# Patient Record
Sex: Female | Born: 2000 | Hispanic: Yes | Marital: Single | Smoking: Never smoker
Health system: Southern US, Community
[De-identification: ages and names within clinical notes are randomized; demographics above are authoritative.]

## PROBLEM LIST (undated history)

## (undated) DIAGNOSIS — E109 Type 1 diabetes mellitus without complications: Secondary | ICD-10-CM

## (undated) DIAGNOSIS — F419 Anxiety disorder, unspecified: Secondary | ICD-10-CM

## (undated) DIAGNOSIS — S82892A Other fracture of left lower leg, initial encounter for closed fracture: Secondary | ICD-10-CM

## (undated) HISTORY — PX: TYMPANOSTOMY TUBE PLACEMENT: SHX32

## (undated) HISTORY — DX: Anxiety disorder, unspecified: F41.9

## (undated) HISTORY — DX: Type 1 diabetes mellitus without complications: E10.9

---
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
## 2021-10-18 IMAGING — DX DG HAND COMPLETE 3+V*R*
3 series · 3 of 3 positions shown · non-contrast
Comparison: None.

CLINICAL DATA: Right hand and wrist pain after injury. Hit with
ball in right wrist.

EXAM:
RIGHT HAND - COMPLETE 3+ VIEW

[hand pa]
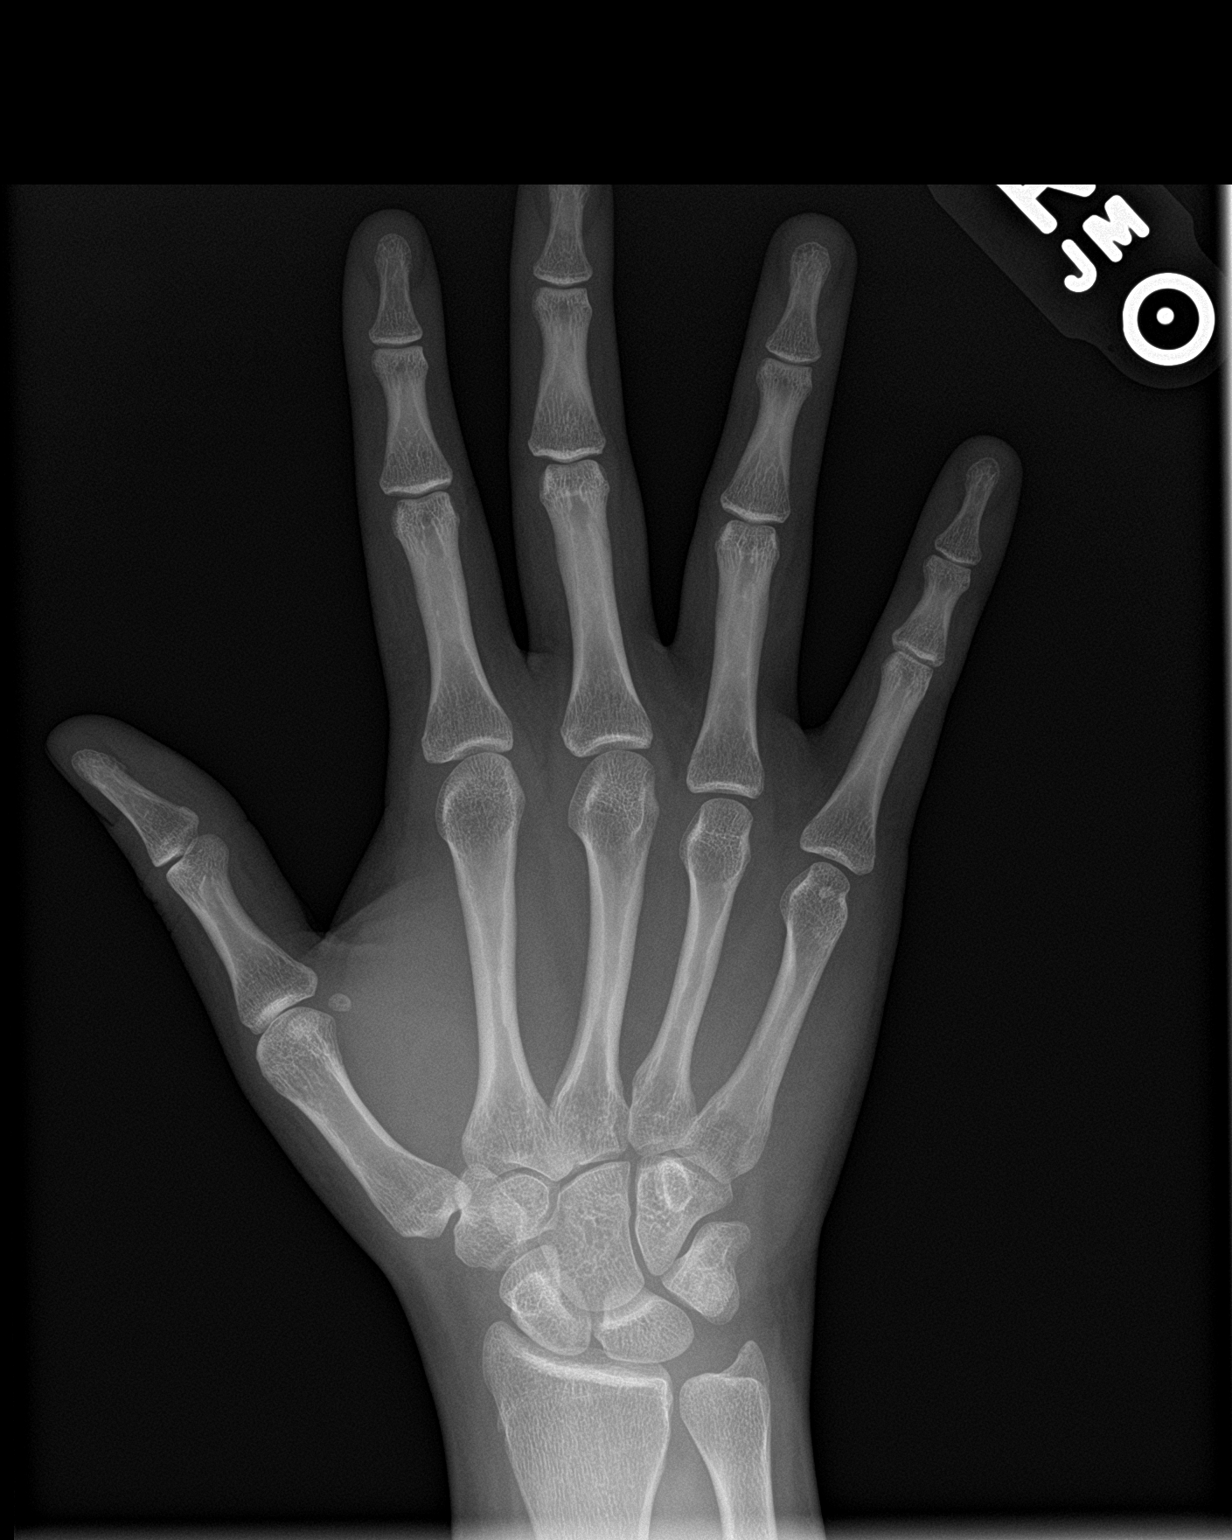

[hand obl]
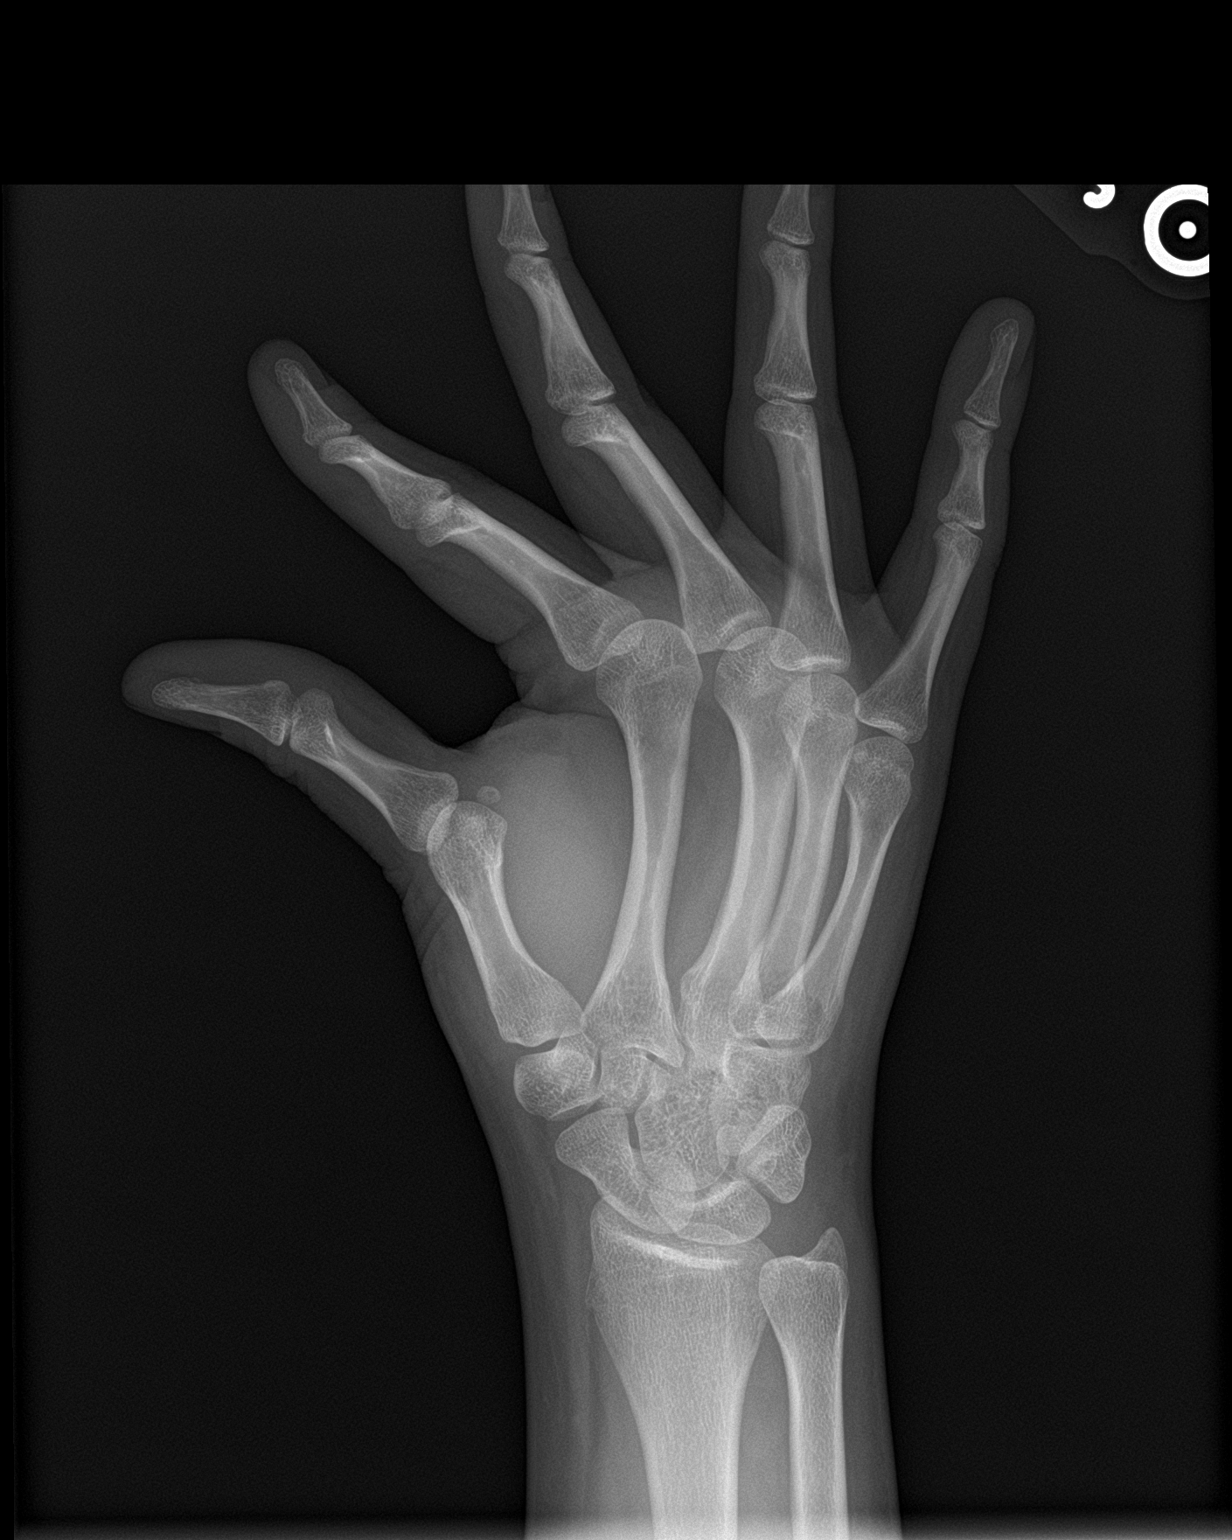

[hand lat]
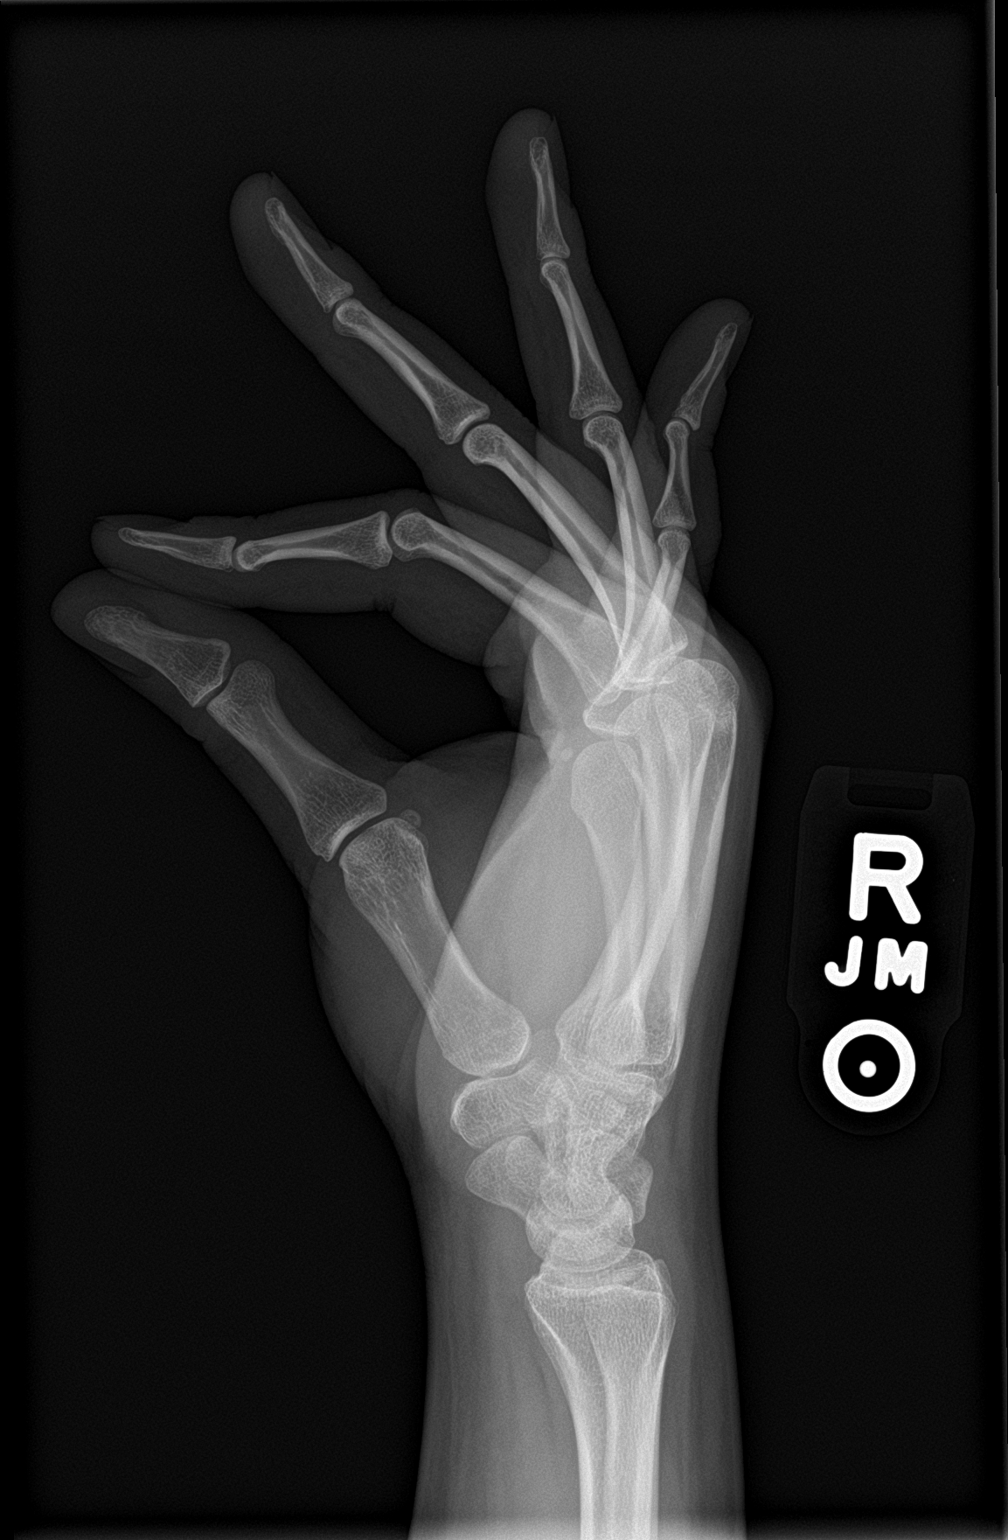

[3 of 3 positions shown; findings below may reference images not displayed]

FINDINGS: There is no evidence of fracture or dislocation. There is no
evidence of arthropathy or other focal bone abnormality. Soft
tissues are unremarkable.
IMPRESSION: Negative radiographs of the right hand.

## 2022-09-13 ENCOUNTER — Ambulatory Visit: Payer: No Typology Code available for payment source | Admitting: Family Medicine

## 2022-09-20 ENCOUNTER — Ambulatory Visit (INDEPENDENT_AMBULATORY_CARE_PROVIDER_SITE_OTHER): Payer: No Typology Code available for payment source | Admitting: Family Medicine

## 2022-09-20 ENCOUNTER — Encounter: Payer: Self-pay | Admitting: Family Medicine

## 2022-09-20 VITALS — BP 118/70 | HR 89 | Temp 97.3°F | Ht 66.5 in | Wt 178.4 lb

## 2022-09-20 DIAGNOSIS — G8929 Other chronic pain: Secondary | ICD-10-CM | POA: Diagnosis not present

## 2022-09-20 DIAGNOSIS — M545 Low back pain, unspecified: Secondary | ICD-10-CM | POA: Diagnosis not present

## 2022-09-20 DIAGNOSIS — E1069 Type 1 diabetes mellitus with other specified complication: Secondary | ICD-10-CM | POA: Diagnosis not present

## 2022-09-20 DIAGNOSIS — Z23 Encounter for immunization: Secondary | ICD-10-CM | POA: Diagnosis not present

## 2022-09-20 DIAGNOSIS — F419 Anxiety disorder, unspecified: Secondary | ICD-10-CM | POA: Diagnosis not present

## 2022-09-20 DIAGNOSIS — Z7184 Encounter for health counseling related to travel: Secondary | ICD-10-CM | POA: Diagnosis not present

## 2022-09-20 DIAGNOSIS — E109 Type 1 diabetes mellitus without complications: Secondary | ICD-10-CM | POA: Insufficient documentation

## 2022-09-20 DIAGNOSIS — F32A Depression, unspecified: Secondary | ICD-10-CM

## 2022-09-20 LAB — POC URINALSYSI DIPSTICK (AUTOMATED)
Bilirubin, UA: NEGATIVE
Blood, UA: NEGATIVE
Glucose, UA: POSITIVE — AB
Ketones, UA: NEGATIVE
Leukocytes, UA: NEGATIVE
Nitrite, UA: NEGATIVE
Protein, UA: NEGATIVE
Spec Grav, UA: >=1.03 — AB (ref 1.010–1.025)
Urobilinogen, UA: 0.2 E.U./dL
pH, UA: 5.5 (ref 5.0–8.0)

## 2022-09-20 NOTE — Progress Notes (Unsigned)
Ph: [phone] Fax: [phone]   Patient ID: Debbie Mckinney, female    DOB: 06-25-2000, 22 y.o.   MRN: [id number]  This visit was conducted in person.  BP 118/70   Pulse 89   Temp (!) 97.3 F (36.3 C) (Temporal)   Ht 5' 6.5" (1.689 m)   Wt 178 lb 6 oz (80.9 kg)   LMP 08/21/2022   SpO2 97%   BMI 28.36 kg/m    CC: new pt to establish  Subjective:   HPI: Debbie Mckinney is a 22 y.o. female presenting on 09/20/2022 for New Patient (Initial Visit)   Previously saw [facility],   Known Type 1 Diabetes diagnosed 01/2015 followed by Dr Leda Roys [facility] Endo managed on Humalog Omnipod insulin pump . Uses Dexcom continuous glucose monitor as well (54u daily, 35.4 basal, 23.2 bolus). Latest A1c 9.4%.   Bilateral low back pain without radiation present for the past 1 year. Denies inciting trauma/injury or falls. Can be intense pain for 30 min at a time. Can go weeks without pain. Can come on when supine.  No fever, no dysuria, urgency, frequency, lower abd pain, nausea/vomiting, flank pain. No blood in urine.   Denies h/o scoliosis.  H/o back pain in middle school - this resolved.  Sister has PCOS.  Unsure if related to cycle.   H/o anxiety - s/p counseling, prior on SSRI but no longer. Seeing counselor Genia Hotter in [place] ([facility]), sees QO week.   She sees GYN Ronney Lion at [facility]. Has IUD, placed 11/2021. Continues monthly periods without heavy bleeding 08/21/2022 - anticipate copper IUD.   Junior at [employer] double majoring in geology and geography.  Used to play rugby, now going to gym.   Planning study abroad in [place] 10/24/2022 for 6 wks.  Awarded grant to do research in the [employer].  Asks about travel advice.  Has been to [place] previously.   Currently dating.  Alcohol - 1-2 drinks/week  Non smoker  No rec drugs.      Relevant past medical, surgical, family and social history reviewed and updated as indicated.  Interim medical history since our last visit reviewed. Allergies and medications reviewed and updated. Outpatient Medications Prior to Visit  Medication Sig Dispense Refill   insulin lispro (HUMALOG) 100 UNIT/ML injection USE UP TO 120 UNITS DAILY IN INSULIN PUMP (90 DAY SUPPLY)     HYDROcodone-acetaminophen (NORCO/VICODIN) 5-325 MG per tablet Take 1-2 tablets by mouth every 4 (four) hours as needed for moderate pain. 40 tablet 0   No facility-administered medications prior to visit.     Per HPI unless specifically indicated in ROS section below Review of Systems  Objective:  BP 118/70   Pulse 89   Temp (!) 97.3 F (36.3 C) (Temporal)   Ht 5' 6.5" (1.689 m)   Wt 178 lb 6 oz (80.9 kg)   LMP 08/21/2022   SpO2 97%   BMI 28.36 kg/m   Wt Readings from Last 3 Encounters:  09/20/22 178 lb 6 oz (80.9 kg)  07/28/14 160 lb (72.6 kg) (96 %, Z= 1.70)*   * Growth percentiles are based on CDC (Girls, 2-20 Years) data.      Physical Exam Vitals and nursing note reviewed.  Constitutional:      Appearance: Normal appearance. She is not ill-appearing.  HENT:     Head: Normocephalic and atraumatic.     Right Ear: Tympanic membrane, ear canal and external ear normal. There is  no impacted cerumen.     Left Ear: Tympanic membrane, ear canal and external ear normal. There is no impacted cerumen.     Mouth/Throat:     Comments: Wearing mask Eyes:     General:        Right eye: No discharge.        Left eye: No discharge.     Extraocular Movements: Extraocular movements intact.     Conjunctiva/sclera: Conjunctivae normal.     Pupils: Pupils are equal, round, and reactive to light.  Neck:     Thyroid: No thyroid mass or thyromegaly.  Cardiovascular:     Rate and Rhythm: Normal rate and regular rhythm.     Pulses: Normal pulses.     Heart sounds: Normal heart sounds. No murmur heard. Pulmonary:     Effort: Pulmonary effort is normal. No respiratory distress.     Breath sounds: Normal  breath sounds. No wheezing, rhonchi or rales.  Abdominal:     General: Bowel sounds are normal. There is no distension.     Palpations: Abdomen is soft. There is no mass.     Tenderness: There is no abdominal tenderness. There is no guarding or rebound.     Hernia: No hernia is present.  Musculoskeletal:        General: No tenderness.     Cervical back: Normal range of motion and neck supple. No rigidity.     Right lower leg: No edema.     Left lower leg: No edema.     Comments:  No pain midline spine No paraspinous mm tenderness Neg SLR bilaterally. No pain with int/ext rotation at hip. Neg FABER. No pain at SIJ, GTB or sciatic notch bilaterally.  Neg stork test bilaterally No obvious thoracic  or lumbar scoliosis  Lymphadenopathy:     Cervical: No cervical adenopathy.  Skin:    General: Skin is warm and dry.     Findings: No rash.  Neurological:     General: No focal deficit present.     Mental Status: She is alert. Mental status is at baseline.  Psychiatric:        Mood and Affect: Mood normal.        Behavior: Behavior normal.       Results for orders placed or performed in visit on 09/20/22  POCT Urinalysis Dipstick (Automated)  Result Value Ref Range   Color, UA yellow    Clarity, UA clear    Glucose, UA Positive (A) Negative   Bilirubin, UA negative    Ketones, UA negative    Spec Grav, UA >=1.030 (A) 1.010 - 1.025   Blood, UA negative    pH, UA 5.5 5.0 - 8.0   Protein, UA Negative Negative   Urobilinogen, UA 0.2 0.2 or 1.0 E.U./dL   Nitrite, UA negative    Leukocytes, UA Negative Negative      09/20/2022    8:20 AM  Depression screen PHQ 2/9  Decreased Interest 1  Down, Depressed, Hopeless 0  PHQ - 2 Score 1  Altered sleeping 1  Tired, decreased energy 2  Change in appetite 1  Feeling bad or failure about yourself  0  Trouble concentrating 0  Moving slowly or fidgety/restless 0  Suicidal thoughts 0  PHQ-9 Score 5  Difficult doing work/chores Not  difficult at all       09/20/2022    8:20 AM  GAD 7 : Generalized Anxiety Score  Nervous, Anxious, on Edge 2  Control/stop worrying 1  Worry too much - different things 1  Restless 0  Easily annoyed or irritable 0  Afraid - awful might happen 1  Anxiety Difficulty Not difficult at all   Assessment & Plan:   Problem List Items Addressed This Visit     Diabetes mellitus type 1 (HCC) - Primary    Chronic, followed by [facility] endo Dr Leda Roys on Humalog Omnipod insulin pump.  Latest A1c 9.4% - elevated. She has implemented regimen changes including updating pump alarms to work towards better glycemic control.       Relevant Medications   insulin lispro (HUMALOG) 100 UNIT/ML injection   Chronic bilateral low back pain    Chronic, present for about a year. Denies inciting trauma.  No pattern/triggers identified.  Check UA today, next step is lumbar xrays - she will let me know - then PT course.       Relevant Orders   POCT Urinalysis Dipstick (Automated) (Completed)   Travel advice encounter    Upcoming prolonged summer study abroad trip to [place] in [place]. It doesn't seem Malaria prophylaxis is recommended for this area. I did ask her to verify with her study abroad program and let me know if they do recommend malaria ppx - would then likely prescribe Malarone.  Recommend yellow fever and typhoid vaccination - to check availability at local health department. If typhoid unavailable, I will Rx oral vivotif live typhoid vaccine.  Will offer azithromycin Rx to use PRN travelers' diarrhea.  Discussed mosquito bite avoidance measures and safe water drinking measures.  Update Tdap today (almost 10 yrs).       Anxiety and depression    H/o this, stable period seeing counselor Genia Hotter with [facility] in the [place], Q2 wks.       Other Visit Diagnoses     Need for Tdap vaccination       Relevant Orders   Tdap vaccine greater than  or equal to 7yo IM (Completed)        No orders of the defined types were placed in this encounter.   Orders Placed This Encounter  Procedures   Tdap vaccine greater than or equal to 7yo IM   POCT Urinalysis Dipstick (Automated)    Patient Instructions  Tdap today.  Urinalysis today.  Check on yellow fever vaccine through your local health department.  Check with program you're going to for summer study abroad about malaria prophylaxis - let me know if they do recommend this and I will prescribe.  Good to see you today!  Continue to work on sugar control.  For back pain - try to see if related to period. May take tylenol, ibuprofen. If ongoing let me know for lower back xrays.   Follow up plan: Return if symptoms worsen or fail to improve.  Eustaquio Boyden, MD

## 2022-09-20 NOTE — Patient Instructions (Addendum)
Tdap today.  Urinalysis today.  Check on yellow fever vaccine through your local health department.  Check with program you're going to for summer study abroad about malaria prophylaxis - let me know if they do recommend this and I will prescribe.  Good to see you today!  Continue to work on sugar control.  For back pain - try to see if related to period. May take tylenol, ibuprofen. If ongoing let me know for lower back xrays.

## 2022-09-21 ENCOUNTER — Telehealth: Payer: Self-pay | Admitting: Family Medicine

## 2022-09-21 ENCOUNTER — Encounter: Payer: Self-pay | Admitting: Family Medicine

## 2022-09-21 DIAGNOSIS — F419 Anxiety disorder, unspecified: Secondary | ICD-10-CM | POA: Insufficient documentation

## 2022-09-21 DIAGNOSIS — F418 Other specified anxiety disorders: Secondary | ICD-10-CM | POA: Insufficient documentation

## 2022-09-21 DIAGNOSIS — Z7184 Encounter for health counseling related to travel: Secondary | ICD-10-CM | POA: Insufficient documentation

## 2022-09-21 NOTE — Assessment & Plan Note (Signed)
Chronic, followed by [facility] endo Dr Leda Roys on Humalog Omnipod insulin pump.  Latest A1c 9.4% - elevated. She has implemented regimen changes including updating pump alarms to work towards better glycemic control.

## 2022-09-21 NOTE — Assessment & Plan Note (Signed)
H/o this, stable period seeing counselor Genia Hotter with [facility] in the [place], Q2 wks.

## 2022-09-21 NOTE — Telephone Encounter (Signed)
Will need to call to discuss Typhoid vaccine options as well as antibiotic for traveler's diarrhea

## 2022-09-21 NOTE — Assessment & Plan Note (Addendum)
Upcoming prolonged summer study abroad trip to [place] in [place] in [place]. It doesn't seem Malaria prophylaxis is recommended for this area. I did ask her to verify with her study abroad program and let me know if they do recommend malaria ppx - would then likely prescribe Malarone.  Recommend yellow fever and typhoid vaccination - to check availability at local health department. If typhoid unavailable, I will Rx oral vivotif live typhoid vaccine.  Will offer azithromycin Rx to use PRN travelers' diarrhea.  Discussed mosquito bite avoidance measures and safe water drinking measures.  Update Tdap today (almost 10 yrs).

## 2022-09-21 NOTE — Assessment & Plan Note (Signed)
Chronic, present for about a year. Denies inciting trauma.  No pattern/triggers identified.  Check UA today, next step is lumbar xrays - she will let me know - then PT course.

## 2022-09-24 ENCOUNTER — Ambulatory Visit: Payer: No Typology Code available for payment source | Admitting: Family Medicine

## 2022-09-27 NOTE — Telephone Encounter (Signed)
Called, unable to reach. Will try again later.  Want to offer live vivotif Typhoid vaccine as well as WASP for azithromycin antibiotic in case develops traveler's diarrhea, also check on yellow fever vaccine status through health department.

## 2022-10-01 NOTE — Telephone Encounter (Signed)
Again straight to voicemail.

## 2022-10-01 NOTE — Telephone Encounter (Signed)
Called again, left message. Will try again later.  

## 2022-10-02 ENCOUNTER — Ambulatory Visit: Payer: No Typology Code available for payment source | Admitting: Family Medicine

## 2022-10-02 NOTE — Telephone Encounter (Signed)
Spoke to pt, pt states she was fine with receiving the Typhoid vaccine as well as WASP. Pt states she was trying to see if it would be easier to receive the yellow fever vaccine here in the states or in [place]. Pt states the best time to call her is in the mornings until she goes to work at [employer]. Call back # [phone]

## 2022-10-03 MED ORDER — AZITHROMYCIN 500 MG PO TABS
500.0000 mg | ORAL_TABLET | Freq: Every day | ORAL | 0 refills | Status: DC
Start: 2022-10-03 — End: 2023-03-10

## 2022-10-03 MED ORDER — VIVOTIF PO CPDR: 1.0000 | DELAYED_RELEASE_CAPSULE | ORAL | 0 refills | Status: DC

## 2022-10-03 NOTE — Telephone Encounter (Signed)
Spoke with patient regarding below. Reviewed side effects of Vivotif vaccine. Discussed azithromycin use PRN traveller's diarrhea She will likely get yellow fever vaccine at [place] health department.

## 2022-10-03 NOTE — Addendum Note (Signed)
Addended by: Eustaquio Boyden on: 10/03/2022 10:43 AM   Modules accepted: Orders

## 2023-03-10 ENCOUNTER — Encounter: Payer: Self-pay | Admitting: Family Medicine

## 2023-03-10 ENCOUNTER — Telehealth (INDEPENDENT_AMBULATORY_CARE_PROVIDER_SITE_OTHER): Payer: No Typology Code available for payment source | Admitting: Family Medicine

## 2023-03-10 VITALS — Temp 97.3°F | Ht 66.5 in | Wt 179.0 lb

## 2023-03-10 DIAGNOSIS — F418 Other specified anxiety disorders: Secondary | ICD-10-CM | POA: Diagnosis not present

## 2023-03-10 MED ORDER — BUPROPION HCL ER (SR) 100 MG PO TB12
100.0000 mg | ORAL_TABLET | Freq: Every day | ORAL | 6 refills | Status: DC
Start: 2023-03-10 — End: 2023-04-11

## 2023-03-10 NOTE — Progress Notes (Signed)
Ph: [phone] Fax: [phone]   Patient ID: Debbie Mckinney, female    DOB: [birth year], 22 y.o.   MRN: [id number]  Virtual visit completed through MyChart, a video enabled telemedicine application. Due to national recommendations of social distancing due to COVID-19, a virtual visit is felt to be most appropriate for this patient at this time. Reviewed limitations, risks, security and privacy concerns of performing a virtual visit and the availability of in person appointments. I also reviewed that there may be a patient responsible charge related to this service. The patient agreed to proceed.   Patient location: home (at her [employer])  Provider location: Adult nurse at [facility], office Persons participating in this virtual visit: patient, provider   If any vitals were documented, they were collected by patient at home unless specified below.    Temp (!) 97.3 F (36.3 C)   Ht 5' 6.5" (1.689 m)   Wt 179 lb (81.2 kg)   LMP 02/20/2023   BMI 28.46 kg/m    CC: discuss depression Subjective:   HPI: Debbie Mckinney is a 22 y.o. female presenting on 03/10/2023 for Medical Management of Chronic Issues (Wants to discuss restarting antidepressant. )   Senior at [employer] double majoring in geology and geography.   H/o anxiety s/p counseling, prior on SSRI but no longer. Seeing counselor Genia Hotter in [place] ([facility]), sees QO week.   She would like to restart SSRI - on and off since HS. Previously tried Lexapro and Wellbutrin, off for the past year, has tolerated well but SSRI caused GI upset and wellbutrin caused some insomnia. Didn't do as well on Zoloft. Increasing stress - finishing senior year, applying for grad school [employer]). Component of seasonal depressed mood with darker/colder weather. She found wellbutrin helped focus - ?ADHD component.   Depression >anxiety.  Appetite ok, sleeping well, concentration ok.   No SI/HI.  Stress relieving  techniques- journaling, running.   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 Also found cyst in ovary pending rpt [place]. T1DM - latest A1c was improved (01/2023)     Relevant past medical, surgical, family and social history reviewed and updated as indicated. Interim medical history since our last visit reviewed. Allergies and medications reviewed and updated. Outpatient Medications Prior to Visit  Medication Sig Dispense Refill   insulin lispro (HUMALOG) 100 UNIT/ML injection USE UP TO 120 UNITS DAILY IN INSULIN PUMP (90 DAY SUPPLY)     azithromycin (ZITHROMAX) 500 MG tablet Take 1 tablet (500 mg total) by mouth daily. Take if you develop traveler's diarrhea 3 tablet 0   typhoid (VIVOTIF) DR capsule Take 1 capsule by mouth every other day. Typhoid vaccine - start at least 2 weeks prior to travel 4 capsule 0   No facility-administered medications prior to visit.     Per HPI unless specifically indicated in ROS section below Review of Systems Objective:  Temp (!) 97.3 F (36.3 C)   Ht 5' 6.5" (1.689 m)   Wt 179 lb (81.2 kg)   LMP 02/20/2023   BMI 28.46 kg/m   Wt Readings from Last 3 Encounters:  03/10/23 179 lb (81.2 kg)  09/20/22 178 lb 6 oz (80.9 kg)  07/28/14 160 lb (72.6 kg) (96%, Z= 1.70)*   * Growth percentiles are based on CDC (Girls, 2-20 Years) data.       Physical exam: Gen: alert, NAD, not ill appearing Pulm: speaks in complete sentences without increased  work of breathing Psych: normal mood, normal thought content         09/20/2022    8:20 AM  Depression screen PHQ 2/9  Decreased Interest 1  Down, Depressed, Hopeless 0  PHQ - 2 Score 1  Altered sleeping 1  Tired, decreased energy 2  Change in appetite 1  Feeling bad or failure about yourself  0  Trouble concentrating 0  Moving slowly or fidgety/restless 0  Suicidal thoughts 0  PHQ-9 Score 5  Difficult doing work/chores Not difficult at all        09/20/2022    8:20 AM  GAD 7 : Generalized Anxiety Score  Nervous, Anxious, on Edge 2  Control/stop worrying 1  Worry too much - different things 1  Restless 0  Easily annoyed or irritable 0  Afraid - awful might happen 1  Anxiety Difficulty Not difficult at all   Assessment & Plan:   Depression with anxiety Assessment & Plan: Notes worsening anxiety for the past few months, associated with increased stressors. Reviewed healthy stress relieving strategies.  Reviewed medication - will restart Wellbutrin SR 100mg  once daily in effort to help energy, prevent insomnia side effect. Reviewed possible increased suicidality with antidepressant commencement.  She agrees with plan. I asked her to keep me updated over MyChart with dictation effect.   Other orders -     buPROPion HCl ER (SR); Take 1 tablet (100 mg total) by mouth daily.  Dispense: 30 tablet; Refill: 6     I discussed the assessment and treatment plan with the patient. The patient was provided an opportunity to ask questions and all were answered. The patient agreed with the plan and demonstrated an understanding of the instructions. The patient was advised to call back or seek an in-person evaluation if the symptoms worsen or if the condition fails to improve as anticipated.  Follow up plan: No follow-ups on file.  Eustaquio Boyden, MD

## 2023-03-10 NOTE — Assessment & Plan Note (Signed)
Notes worsening anxiety for the past few months, associated with increased stressors. Reviewed healthy stress relieving strategies.  Reviewed medication - will restart Wellbutrin SR 100mg  once daily in effort to help energy, prevent insomnia side effect. Reviewed possible increased suicidality with antidepressant commencement.  She agrees with plan. I asked her to keep me updated over MyChart with dictation effect.

## 2023-04-11 ENCOUNTER — Other Ambulatory Visit: Payer: Self-pay | Admitting: Family Medicine

## 2023-06-09 LAB — HM DIABETES EYE EXAM

## 2024-04-17 ENCOUNTER — Other Ambulatory Visit: Payer: Self-pay | Admitting: Family Medicine

## 2024-04-17 NOTE — Telephone Encounter (Signed)
 ERx 6 months worth. Last seen 03/2023.  Plz schedule f/u OV over next 6 months, ok for virtual but she needs to be in [place] for virtual visit.

## 2024-04-19 NOTE — Telephone Encounter (Signed)
Called and schedule pt for f/u.

## 2024-04-30 ENCOUNTER — Ambulatory Visit: Admitting: Family Medicine

## 2024-05-18 ENCOUNTER — Other Ambulatory Visit: Payer: Self-pay | Admitting: Family Medicine
